# Patient Record
Sex: Male | Born: 1975 | Race: White | Hispanic: No | Marital: Married | Smoking: Current every day smoker
Health system: Southern US, Community
[De-identification: ages and names within clinical notes are randomized; demographics above are authoritative.]

## PROBLEM LIST (undated history)

## (undated) HISTORY — PX: ANKLE FRACTURE SURGERY: SHX122

---

## 1997-10-26 ENCOUNTER — Emergency Department (HOSPITAL_COMMUNITY): Admission: EM | Admit: 1997-10-26 | Discharge: 1997-10-26 | Payer: Self-pay | Admitting: Emergency Medicine

## 2005-06-18 ENCOUNTER — Encounter: Payer: Self-pay | Admitting: Emergency Medicine

## 2009-06-04 ENCOUNTER — Inpatient Hospital Stay (HOSPITAL_COMMUNITY): Admission: EM | Admit: 2009-06-04 | Discharge: 2009-06-05 | Payer: Self-pay | Admitting: Emergency Medicine

## 2010-05-07 LAB — CBC
HCT: 43.9 % (ref 39.0–52.0)
Hemoglobin: 15.6 g/dL (ref 13.0–17.0)
MCHC: 35.4 g/dL (ref 30.0–36.0)
MCV: 91 fL (ref 78.0–100.0)
RBC: 4.83 MIL/uL (ref 4.22–5.81)
RDW: 13 % (ref 11.5–15.5)

## 2010-05-07 LAB — BASIC METABOLIC PANEL
CO2: 26 mEq/L (ref 19–32)
Chloride: 105 mEq/L (ref 96–112)
Glucose, Bld: 106 mg/dL — ABNORMAL HIGH (ref 70–99)
Potassium: 3.7 mEq/L (ref 3.5–5.1)
Sodium: 138 mEq/L (ref 135–145)

## 2010-05-07 LAB — HEPATIC FUNCTION PANEL
ALT: 46 U/L (ref 0–53)
AST: 27 U/L (ref 0–37)
Alkaline Phosphatase: 74 U/L (ref 39–117)
Bilirubin, Direct: 0.1 mg/dL (ref 0.0–0.3)
Indirect Bilirubin: 0.5 mg/dL (ref 0.3–0.9)

## 2010-05-07 LAB — DIFFERENTIAL
Basophils Absolute: 0.1 10*3/uL (ref 0.0–0.1)
Basophils Relative: 1 % (ref 0–1)
Eosinophils Relative: 0 % (ref 0–5)
Monocytes Absolute: 1.1 10*3/uL — ABNORMAL HIGH (ref 0.1–1.0)
Monocytes Relative: 6 % (ref 3–12)

## 2011-06-15 IMAGING — CR DG ANKLE COMPLETE 3+V*R*
3 series · 3 of 3 positions shown · non-contrast
Comparison: None

CLINICAL DATA: Fell.  Right ankle pain.

RIGHT ANKLE - COMPLETE 3+ VIEW

[view not recorded (1 of 3)]
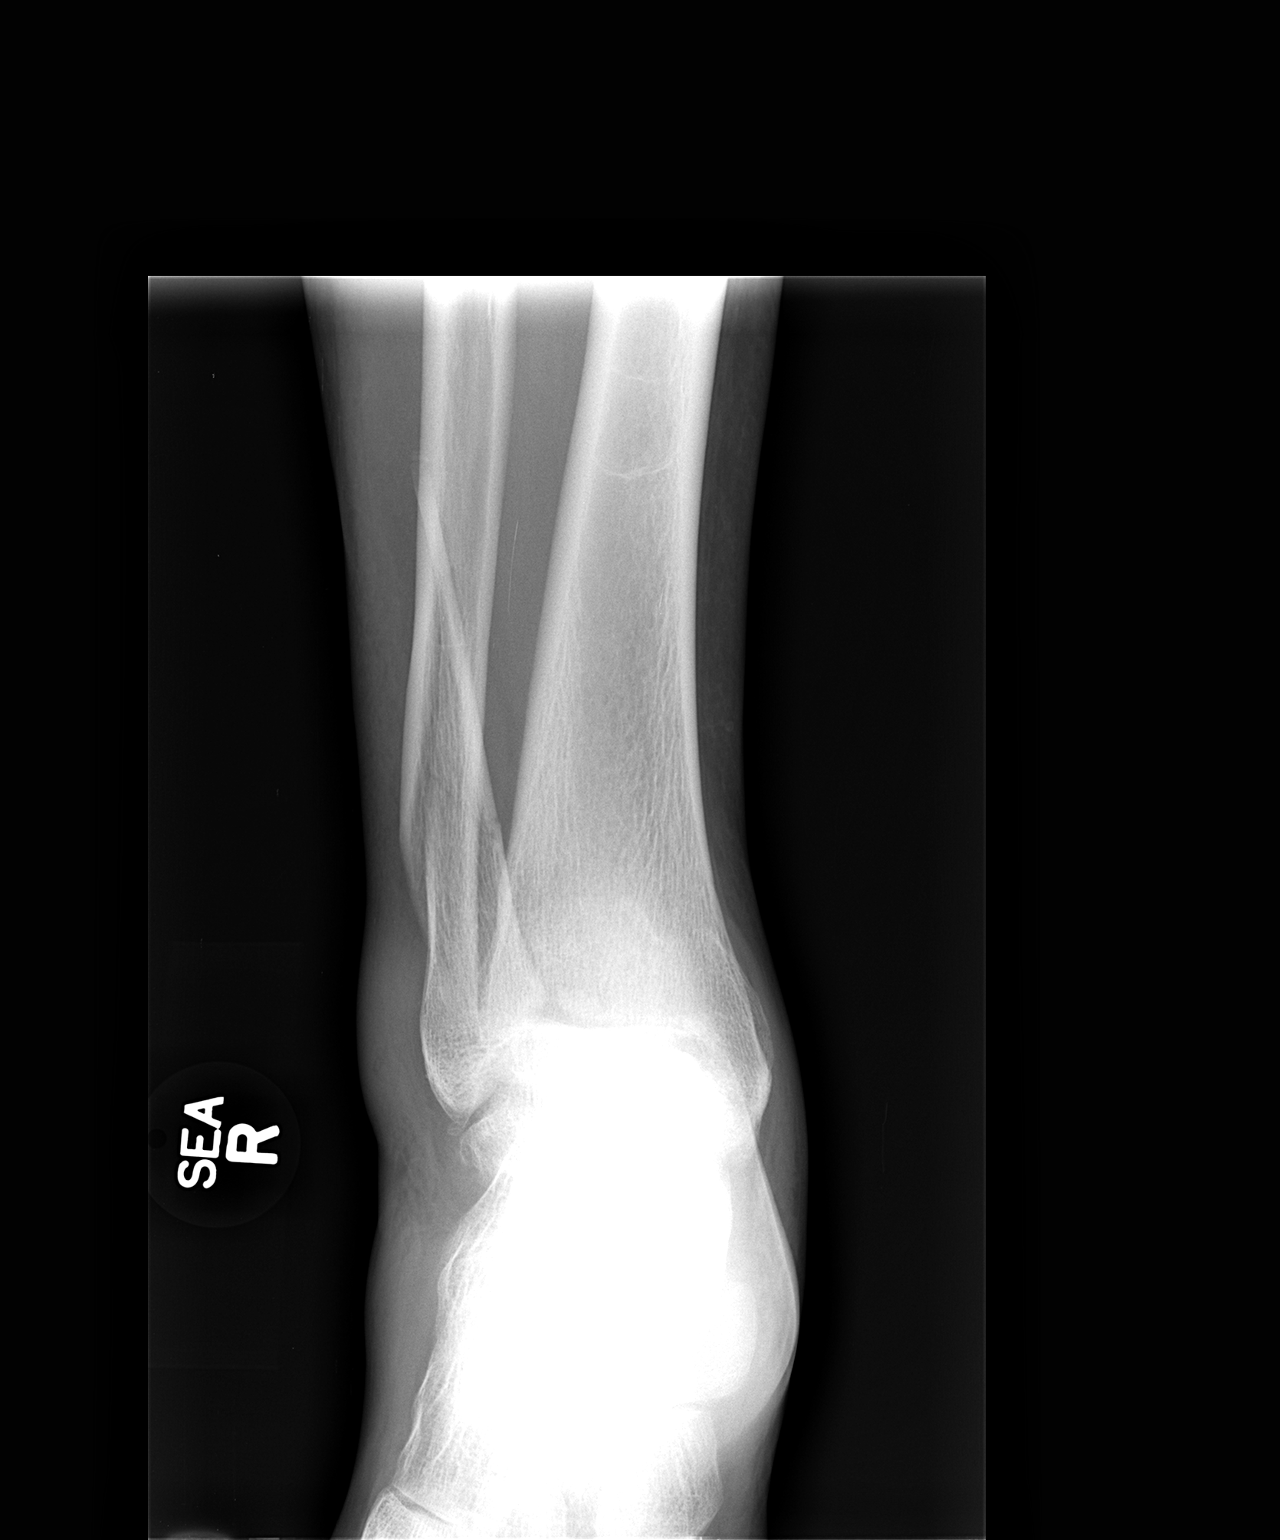

[view not recorded (2 of 3)]
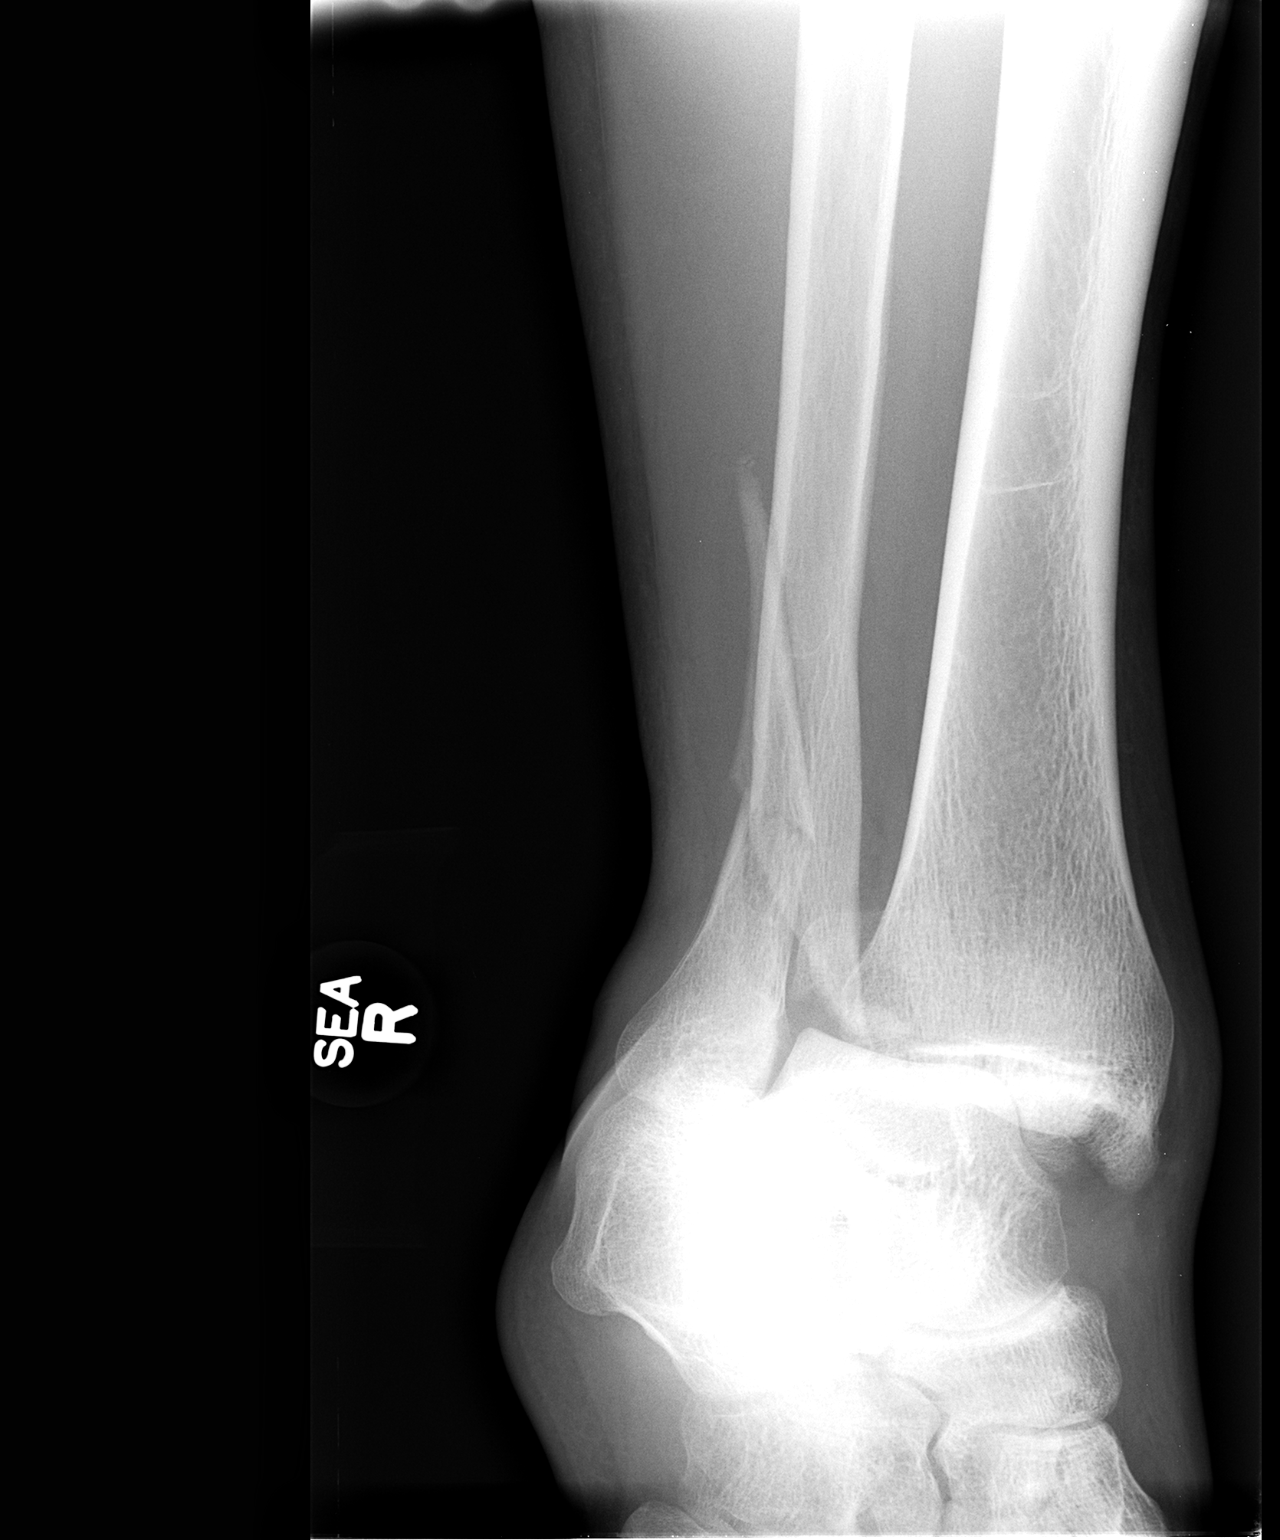

[view not recorded (3 of 3)]
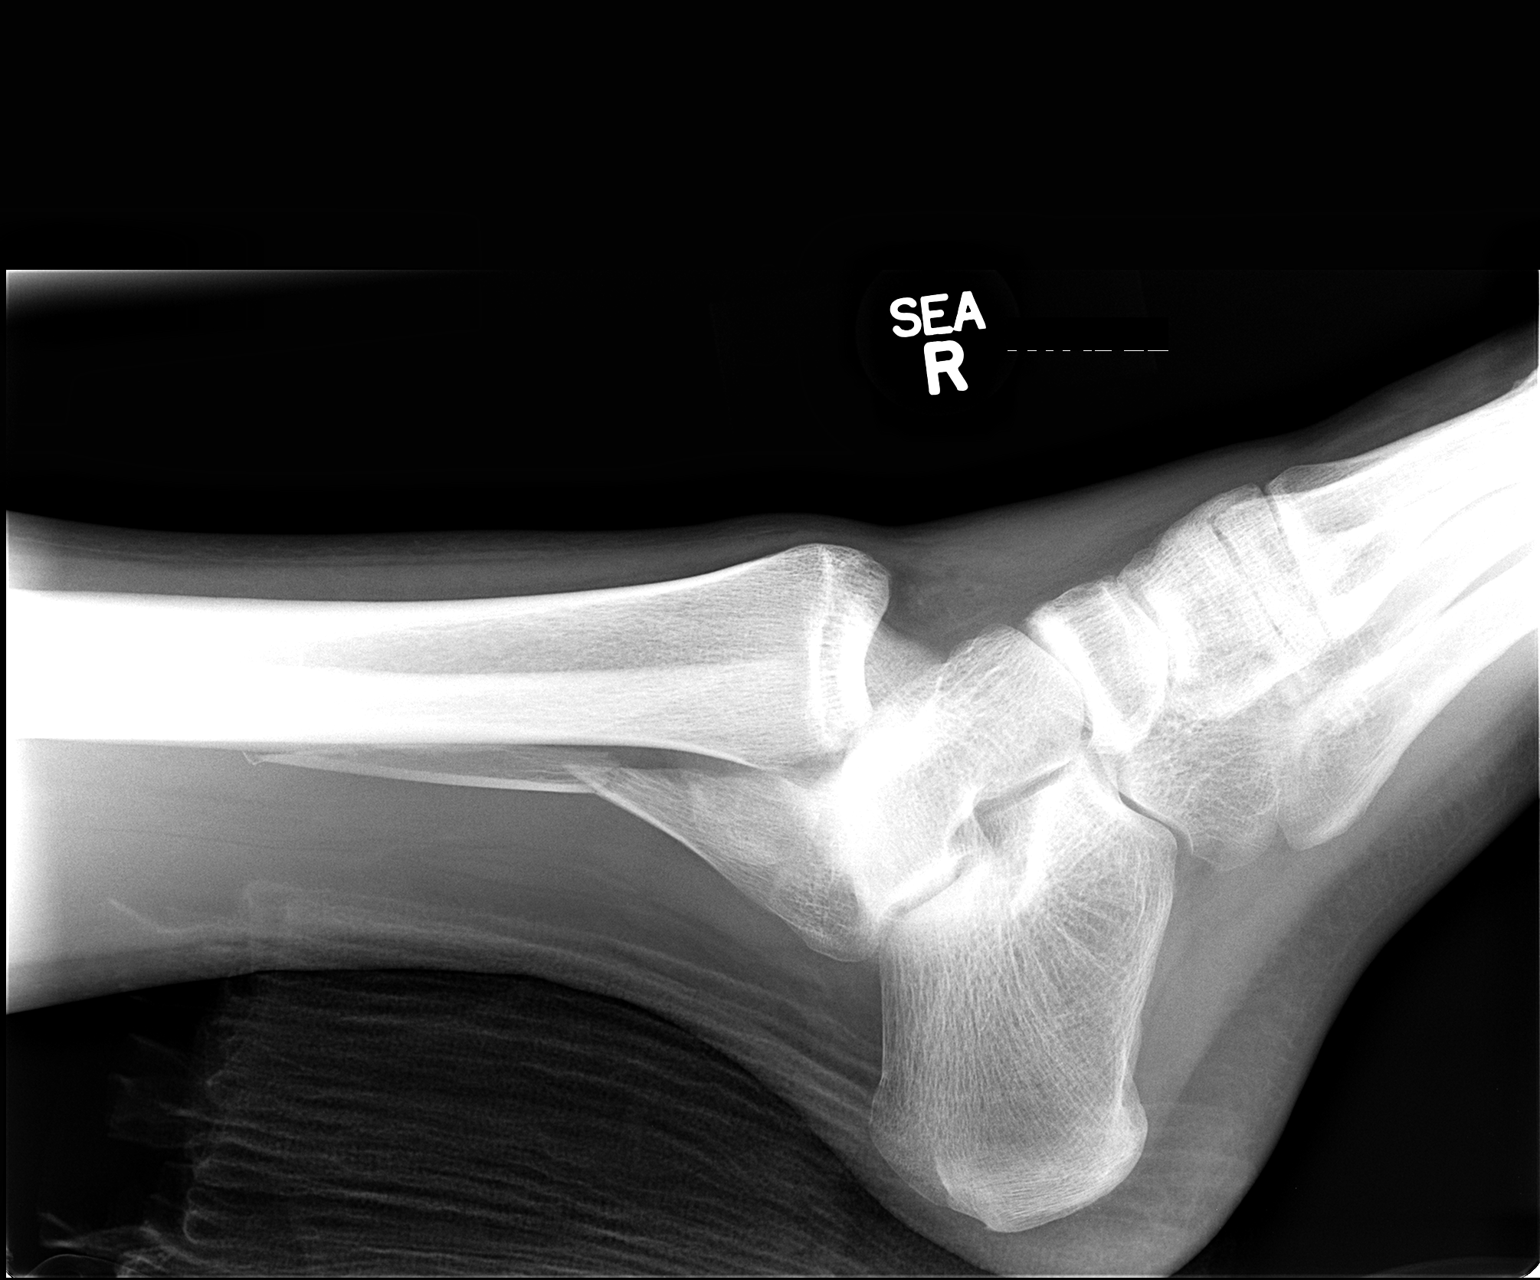

[3 of 3 positions shown; findings below may reference images not displayed]

FINDINGS: There is a fracture dislocation involving the ankle.
There is a posterior malleolar fracture of the tibia and a
posterior dislocation of the talus.  There is a comminuted
displaced fracture involving the distal fibular shaft.
IMPRESSION: Fracture dislocation.

## 2013-06-24 ENCOUNTER — Encounter (HOSPITAL_COMMUNITY): Payer: Self-pay | Admitting: Emergency Medicine

## 2013-06-24 ENCOUNTER — Emergency Department (HOSPITAL_COMMUNITY)
Admission: EM | Admit: 2013-06-24 | Discharge: 2013-06-24 | Disposition: A | Discharge status: Home / Self Care | Payer: Self-pay | Attending: Emergency Medicine | Admitting: Emergency Medicine

## 2013-06-24 DIAGNOSIS — R000 Tachycardia, unspecified: Secondary | ICD-10-CM | POA: Insufficient documentation

## 2013-06-24 DIAGNOSIS — L03818 Cellulitis of other sites: Secondary | ICD-10-CM

## 2013-06-24 DIAGNOSIS — Z87828 Personal history of other (healed) physical injury and trauma: Secondary | ICD-10-CM | POA: Insufficient documentation

## 2013-06-24 DIAGNOSIS — K130 Diseases of lips: Secondary | ICD-10-CM | POA: Insufficient documentation

## 2013-06-24 DIAGNOSIS — F17200 Nicotine dependence, unspecified, uncomplicated: Secondary | ICD-10-CM | POA: Insufficient documentation

## 2013-06-24 DIAGNOSIS — L02818 Cutaneous abscess of other sites: Secondary | ICD-10-CM | POA: Insufficient documentation

## 2013-06-24 LAB — CBC WITH DIFFERENTIAL/PLATELET
Basophils Absolute: 0 10*3/uL (ref 0.0–0.1)
Basophils Relative: 0 % (ref 0–1)
EOS PCT: 1 % (ref 0–5)
Eosinophils Absolute: 0.2 10*3/uL (ref 0.0–0.7)
HCT: 53.8 % — ABNORMAL HIGH (ref 39.0–52.0)
Hemoglobin: 18.6 g/dL — ABNORMAL HIGH (ref 13.0–17.0)
LYMPHS ABS: 2.9 10*3/uL (ref 0.7–4.0)
LYMPHS PCT: 14 % (ref 12–46)
MCH: 32.3 pg (ref 26.0–34.0)
MCHC: 34.6 g/dL (ref 30.0–36.0)
MCV: 93.6 fL (ref 78.0–100.0)
Monocytes Absolute: 2.1 10*3/uL — ABNORMAL HIGH (ref 0.1–1.0)
Monocytes Relative: 10 % (ref 3–12)
Neutro Abs: 16.2 10*3/uL — ABNORMAL HIGH (ref 1.7–7.7)
Neutrophils Relative %: 75 % (ref 43–77)
PLATELETS: 310 10*3/uL (ref 150–400)
RBC: 5.75 MIL/uL (ref 4.22–5.81)
RDW: 13.2 % (ref 11.5–15.5)
WBC: 21.4 10*3/uL — AB (ref 4.0–10.5)

## 2013-06-24 LAB — BASIC METABOLIC PANEL
BUN: 13 mg/dL (ref 6–23)
CALCIUM: 9.7 mg/dL (ref 8.4–10.5)
CO2: 23 meq/L (ref 19–32)
Chloride: 98 mEq/L (ref 96–112)
Creatinine, Ser: 1 mg/dL (ref 0.50–1.35)
GFR calc Af Amer: >90 mL/min (ref >90)
GFR calc non Af Amer: >90 mL/min (ref >90)
GLUCOSE: 121 mg/dL — AB (ref 70–99)
POTASSIUM: 3.8 meq/L (ref 3.7–5.3)
SODIUM: 136 meq/L — AB (ref 137–147)

## 2013-06-24 MED ORDER — MORPHINE SULFATE 4 MG/ML IJ SOLN
4.0000 mg | Freq: Once | INTRAMUSCULAR | Status: AC
Start: 2013-06-24 — End: 2013-06-24
  Administered 2013-06-24: 4 mg via INTRAVENOUS
  Filled 2013-06-24: qty 1

## 2013-06-24 MED ORDER — MORPHINE SULFATE 4 MG/ML IJ SOLN
4.0000 mg | Freq: Once | INTRAMUSCULAR | Status: AC
  Administered 2013-06-24: 4 mg via INTRAVENOUS
  Filled 2013-06-24: qty 1

## 2013-06-24 MED ORDER — CLINDAMYCIN PHOSPHATE 900 MG/50ML IV SOLN
900.0000 mg | Freq: Once | INTRAVENOUS | Status: AC
  Administered 2013-06-24: 900 mg via INTRAVENOUS
  Filled 2013-06-24: qty 50

## 2013-06-24 MED ORDER — SODIUM CHLORIDE 0.9 % IV BOLUS (SEPSIS)
1000.0000 mL | Freq: Once | INTRAVENOUS | Status: AC
  Administered 2013-06-24: 1000 mL via INTRAVENOUS

## 2013-06-24 MED ORDER — OXYCODONE-ACETAMINOPHEN 5-325 MG PO TABS: 1.0000 | ORAL_TABLET | Freq: Four times a day (QID) | ORAL | Status: AC | PRN

## 2013-06-24 MED ORDER — CLINDAMYCIN HCL 300 MG PO CAPS: 300.0000 mg | ORAL_CAPSULE | Freq: Three times a day (TID) | ORAL | Status: AC

## 2013-06-24 MED ORDER — ACETAMINOPHEN 500 MG PO TABS
1000.0000 mg | ORAL_TABLET | Freq: Once | ORAL | Status: AC
  Administered 2013-06-24: 1000 mg via ORAL
  Filled 2013-06-24: qty 2

## 2013-06-24 NOTE — ED Notes (Signed)
Pt waiting in room for mother to pick him up d/t amt of morphine pt received during tx

## 2013-06-24 NOTE — ED Notes (Signed)
Pt c/o increasing lower lip swelling x 2 days.  Pain score 7/10.  Pt denies new medications, lotions, soaps, or creams.  Denies SOB and difficulty swallowing.

## 2013-06-24 NOTE — ED Notes (Signed)
Pt returned from MRI °

## 2013-06-24 NOTE — Discharge Instructions (Signed)
Take clindamycin 300 mg three times a day for 10 days.   Your swelling should improve by Monday.   Take percocet as prescribed for pain. You can't drive with it.   Follow up with your doctor.   Return to ER if you have worse pain or swelling, fever, trouble breathing.

## 2013-06-24 NOTE — ED Provider Notes (Signed)
CSN: [id number]     Arrival date & time 06/24/13  [id number] History   First MD Initiated Contact with Patient 06/24/13 [phone]     Chief Complaint  Patient presents with  . Facial Swelling     (Consider location/radiation/quality/duration/timing/severity/associated sxs/prior Treatment) The history is provided by the patient.  Richard Edwards is a 38 y.o. male hx of previous ankle surgery here with lower lip swelling. He noticed a pimple on the left side of his lower lip 3 days ago. He popped a pimple 3 days ago and then 2 days ago started having swelling and purulent drainage from the wound. He noted that his lower lip has become more swollen. Some subjective fevers at home. Denies any history of dental problems. Denies any trouble breathing. Denies any new medications or lotions or soaps. Not currently on any medicines. Denies any history of MRSA. Also noticed another area of cellulitis on the right temporal area has been painful but no purulent discharge. He smokes and drinks alcohol and uses marijuana but denies any IV drug use.    History reviewed. No pertinent past medical history. Past Surgical History  Procedure Laterality Date  . Ankle fracture surgery Right    History reviewed. No pertinent family history. History  Substance Use Topics  . Smoking status: Current Every Day Smoker -- 1.50 packs/day    Types: Cigarettes  . Smokeless tobacco: Not on file  . Alcohol Use: Yes     Comment: occ    Review of Systems  Constitutional: Positive for fever.  HENT: Positive for facial swelling.   All other systems reviewed and are negative.     Allergies  Review of patient's allergies indicates no known allergies.  Home Medications   Prior to Admission medications   Not on File   BP 140/90  Pulse 103  Temp(Src) 99.4 F (37.4 C) (Oral)  Resp 16  SpO2 96% Physical Exam  Nursing note and vitals reviewed. Constitutional: He is oriented to person, place, and time. He appears  well-developed and well-nourished.  Uncomfortable   HENT:  Head: Normocephalic.  Mouth/Throat: Oropharynx is clear and moist.  No obvious periapical abscess. No tenderness or firmness on floor of mouth. Lower L lip with popped pimple with minimal purulent discharge. Some swelling lower lip. No OP swelling. Small area of cellulitis R temporal area   Eyes: Conjunctivae and EOM are normal. Pupils are equal, round, and reactive to light.  Neck: Neck supple.  Slightly tender L submandibular node. No stridor   Cardiovascular: Normal rate, regular rhythm and normal heart sounds.   Pulmonary/Chest: Effort normal and breath sounds normal. No respiratory distress. He has no wheezes. He has no rales.  Abdominal: Soft. Bowel sounds are normal. He exhibits no distension. There is no tenderness. There is no rebound.  Musculoskeletal: Normal range of motion. He exhibits no edema and no tenderness.  Neurological: He is alert and oriented to person, place, and time. No cranial nerve deficit. Coordination normal.  Skin: Skin is warm and dry.  Psychiatric: He has a normal mood and affect. His behavior is normal. Judgment and thought content normal.    ED Course  Procedures (including critical care time)  EMERGENCY DEPARTMENT US SOFT TISSUE INTERPRETATION "Study: Limited Ultrasound of the noted body part in comments below"  INDICATIONS: Soft tissue infection Multiple views of the body part are obtained with a multi-frequency linear probe  PERFORMED BY:  Myself  IMAGES ARCHIVED?: Yes  SIDE:Midline  BODY PART:Other soft tisse (  comment in note)  FINDINGS: Abcess present and Cellulitis present  LIMITATIONS:  Body Habitus  INTERPRETATION:  Abscess present and cellulitis present   COMMENT:  + cellultiis on L lower lip. Small 0.3 cm abscess. It is self draining already     Labs Review Labs Reviewed  CBC WITH DIFFERENTIAL - Abnormal; Notable for the following:    WBC 21.4 (*)    Hemoglobin 18.6  (*)    HCT 53.8 (*)    Neutro Abs 16.2 (*)    Monocytes Absolute 2.1 (*)    All other components within normal limits  BASIC METABOLIC PANEL - Abnormal; Notable for the following:    Sodium 136 (*)    Glucose, Bld 121 (*)    All other components within normal limits    Imaging Review No results found.   EKG Interpretation None      MDM   Final diagnoses:  None    Richard Edwards is a 38 y.o. male here with lip swelling. Low grade temp, tachycardic. Concerned for celllulitis of the lip. US showed small abscess but its been draining already. Will check labs, give clinda. I think likely MRSA. I don't think he has angioedema.   11:47 AM WBC 21,000. But when he had ankle surgery, WBC was 18,000. He looks well. Given clinda IV. After 1L NS, heart rate dropped from 117 to 103. He feels much better now and wants to go home. Will d/c home on clinda, pain meds. He doesn't have follow up. I told him to come back to ED if lip is more swollen, fever, severe pain.    [name], MD 06/24/13 [phone]
